# Patient Record
Sex: Male | Born: 1990 | Hispanic: Yes | Marital: Married | Smoking: Current some day smoker
Health system: Southern US, Community
[De-identification: ages and names within clinical notes are randomized; demographics above are authoritative.]

## PROBLEM LIST (undated history)

## (undated) DIAGNOSIS — W3400XA Accidental discharge from unspecified firearms or gun, initial encounter: Secondary | ICD-10-CM

## (undated) HISTORY — DX: Accidental discharge from unspecified firearms or gun, initial encounter: W34.00XA

---

## 2017-02-16 ENCOUNTER — Observation Stay (HOSPITAL_COMMUNITY)
Admission: EM | Admit: 2017-02-16 | Discharge: 2017-02-17 | Disposition: A | Discharge status: Home / Self Care | Payer: No Typology Code available for payment source | Attending: General Surgery | Admitting: General Surgery

## 2017-02-16 ENCOUNTER — Emergency Department (HOSPITAL_COMMUNITY): Payer: No Typology Code available for payment source

## 2017-02-16 ENCOUNTER — Encounter (HOSPITAL_COMMUNITY): Payer: Self-pay

## 2017-02-16 DIAGNOSIS — S3210XB Unspecified fracture of sacrum, initial encounter for open fracture: Secondary | ICD-10-CM | POA: Insufficient documentation

## 2017-02-16 DIAGNOSIS — S129XXA Fracture of neck, unspecified, initial encounter: Secondary | ICD-10-CM

## 2017-02-16 DIAGNOSIS — S32302A Unspecified fracture of left ilium, initial encounter for closed fracture: Secondary | ICD-10-CM

## 2017-02-16 DIAGNOSIS — F1490 Cocaine use, unspecified, uncomplicated: Secondary | ICD-10-CM | POA: Diagnosis not present

## 2017-02-16 DIAGNOSIS — Z23 Encounter for immunization: Secondary | ICD-10-CM | POA: Diagnosis not present

## 2017-02-16 DIAGNOSIS — W3400XA Accidental discharge from unspecified firearms or gun, initial encounter: Secondary | ICD-10-CM

## 2017-02-16 DIAGNOSIS — R402412 Glasgow coma scale score 13-15, at arrival to emergency department: Secondary | ICD-10-CM | POA: Diagnosis not present

## 2017-02-16 DIAGNOSIS — S12500B Unspecified displaced fracture of sixth cervical vertebra, initial encounter for open fracture: Principal | ICD-10-CM | POA: Insufficient documentation

## 2017-02-16 DIAGNOSIS — F17210 Nicotine dependence, cigarettes, uncomplicated: Secondary | ICD-10-CM | POA: Insufficient documentation

## 2017-02-16 DIAGNOSIS — S32302B Unspecified fracture of left ilium, initial encounter for open fracture: Secondary | ICD-10-CM | POA: Diagnosis not present

## 2017-02-16 DIAGNOSIS — S12100A Unspecified displaced fracture of second cervical vertebra, initial encounter for closed fracture: Secondary | ICD-10-CM | POA: Diagnosis present

## 2017-02-16 HISTORY — DX: Accidental discharge from unspecified firearms or gun, initial encounter: W34.00XA

## 2017-02-16 LAB — I-STAT CHEM 8, ED
BUN: 15 mg/dL (ref 6–20)
CALCIUM ION: 1.15 mmol/L (ref 1.15–1.40)
CREATININE: 0.8 mg/dL (ref 0.61–1.24)
Chloride: 107 mmol/L (ref 101–111)
GLUCOSE: 110 mg/dL — AB (ref 65–99)
HCT: 40 % (ref 39.0–52.0)
HEMOGLOBIN: 13.6 g/dL (ref 13.0–17.0)
Potassium: 3.6 mmol/L (ref 3.5–5.1)
Sodium: 142 mmol/L (ref 135–145)
TCO2: 25 mmol/L (ref 22–32)

## 2017-02-16 LAB — PREPARE FRESH FROZEN PLASMA
UNIT DIVISION: 0
Unit division: 0

## 2017-02-16 LAB — COMPREHENSIVE METABOLIC PANEL
ALT: 29 U/L (ref 17–63)
ANION GAP: 8 (ref 5–15)
AST: 33 U/L (ref 15–41)
Albumin: 4.1 g/dL (ref 3.5–5.0)
Alkaline Phosphatase: 77 U/L (ref 38–126)
BUN: 14 mg/dL (ref 6–20)
CHLORIDE: 106 mmol/L (ref 101–111)
CO2: 25 mmol/L (ref 22–32)
CREATININE: 0.83 mg/dL (ref 0.61–1.24)
Calcium: 9 mg/dL (ref 8.9–10.3)
Glucose, Bld: 109 mg/dL — ABNORMAL HIGH (ref 65–99)
POTASSIUM: 3.6 mmol/L (ref 3.5–5.1)
Sodium: 139 mmol/L (ref 135–145)
Total Bilirubin: 1.3 mg/dL — ABNORMAL HIGH (ref 0.3–1.2)
Total Protein: 7.1 g/dL (ref 6.5–8.1)

## 2017-02-16 LAB — CDS SEROLOGY

## 2017-02-16 LAB — BPAM RBC
BLOOD PRODUCT EXPIRATION DATE: 201810282359
Blood Product Expiration Date: 201810222359
ISSUE DATE / TIME: 201810080715
ISSUE DATE / TIME: 201810080715
Unit Type and Rh: 9500
Unit Type and Rh: 9500

## 2017-02-16 LAB — TYPE AND SCREEN
ABO/RH(D): O POS
Antibody Screen: NEGATIVE
UNIT DIVISION: 0
Unit division: 0

## 2017-02-16 LAB — PROTIME-INR
INR: 1
PROTHROMBIN TIME: 13.1 s (ref 11.4–15.2)

## 2017-02-16 LAB — ABO/RH: ABO/RH(D): O POS

## 2017-02-16 LAB — CBC
HEMATOCRIT: 41.6 % (ref 39.0–52.0)
Hemoglobin: 14.1 g/dL (ref 13.0–17.0)
MCH: 29.7 pg (ref 26.0–34.0)
MCHC: 33.9 g/dL (ref 30.0–36.0)
MCV: 87.6 fL (ref 78.0–100.0)
PLATELETS: 206 10*3/uL (ref 150–400)
RBC: 4.75 MIL/uL (ref 4.22–5.81)
RDW: 12.4 % (ref 11.5–15.5)
WBC: 6.7 10*3/uL (ref 4.0–10.5)

## 2017-02-16 LAB — BPAM FFP
BLOOD PRODUCT EXPIRATION DATE: 201810122359
Blood Product Expiration Date: 201810122359
ISSUE DATE / TIME: 201810080715
ISSUE DATE / TIME: 201810080715
UNIT TYPE AND RH: 6200
Unit Type and Rh: 6200

## 2017-02-16 LAB — ETHANOL: Alcohol, Ethyl (B): <10 mg/dL (ref <10)

## 2017-02-16 LAB — I-STAT CG4 LACTIC ACID, ED: Lactic Acid, Venous: 1.31 mmol/L (ref 0.5–1.9)

## 2017-02-16 MED ORDER — PANTOPRAZOLE SODIUM 40 MG IV SOLR: 40.0000 mg | Freq: Every day | INTRAVENOUS | Status: DC

## 2017-02-16 MED ORDER — KCL IN DEXTROSE-NACL 20-5-0.45 MEQ/L-%-% IV SOLN
INTRAVENOUS | Status: DC
  Administered 2017-02-16 – 2017-02-17 (×2): via INTRAVENOUS
  Filled 2017-02-16 (×4): qty 1000

## 2017-02-16 MED ORDER — IOPAMIDOL (ISOVUE-300) INJECTION 61%
INTRAVENOUS | Status: AC
  Filled 2017-02-16: qty 100

## 2017-02-16 MED ORDER — IOPAMIDOL (ISOVUE-300) INJECTION 61%
100.0000 mL | Freq: Once | INTRAVENOUS | Status: AC | PRN
  Administered 2017-02-16: 100 mL via INTRAVENOUS

## 2017-02-16 MED ORDER — DOCUSATE SODIUM 100 MG PO CAPS: 100.0000 mg | ORAL_CAPSULE | Freq: Two times a day (BID) | ORAL | Status: DC | PRN

## 2017-02-16 MED ORDER — TETANUS-DIPHTH-ACELL PERTUSSIS 5-2.5-18.5 LF-MCG/0.5 IM SUSP
0.5000 mL | Freq: Once | INTRAMUSCULAR | Status: AC
  Administered 2017-02-16: 0.5 mL via INTRAMUSCULAR
  Filled 2017-02-16: qty 0.5

## 2017-02-16 MED ORDER — HYDROMORPHONE HCL 1 MG/ML IJ SOLN
0.5000 mg | INTRAMUSCULAR | Status: DC | PRN
  Administered 2017-02-16: 1 mg via INTRAVENOUS
  Filled 2017-02-16: qty 1

## 2017-02-16 MED ORDER — ONDANSETRON HCL 4 MG/2ML IJ SOLN: 4.0000 mg | Freq: Four times a day (QID) | INTRAMUSCULAR | Status: DC | PRN

## 2017-02-16 MED ORDER — PANTOPRAZOLE SODIUM 40 MG PO TBEC
40.0000 mg | DELAYED_RELEASE_TABLET | Freq: Every day | ORAL | Status: DC
  Administered 2017-02-16 – 2017-02-17 (×2): 40 mg via ORAL
  Filled 2017-02-16 (×2): qty 1

## 2017-02-16 MED ORDER — OXYCODONE HCL 5 MG PO TABS
5.0000 mg | ORAL_TABLET | ORAL | Status: DC | PRN
  Administered 2017-02-16 – 2017-02-17 (×3): 5 mg via ORAL
  Filled 2017-02-16 (×3): qty 1

## 2017-02-16 MED ORDER — ONDANSETRON 4 MG PO TBDP: 4.0000 mg | ORAL_TABLET | Freq: Four times a day (QID) | ORAL | Status: DC | PRN

## 2017-02-16 MED ORDER — FENTANYL CITRATE (PF) 100 MCG/2ML IJ SOLN
50.0000 ug | Freq: Once | INTRAMUSCULAR | Status: AC
  Administered 2017-02-16: 50 ug via INTRAVENOUS
  Filled 2017-02-16: qty 2
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
## 2017-02-16 NOTE — Progress Notes (Signed)
   02/16/17 0900  Clinical Encounter Type  Visited With Patient  Visit Type Initial  Referral From Chaplain  Consult/Referral To Chaplain  Spiritual Encounters  Spiritual Needs Prayer   Chaplain handed off from overnight Sunday.  Followed up and visited with the patient.  We had prayer together.  Patient being moved to another space.  Was trying to figure out about calling his girlfriend but patient did not have his phone.  Will pass on the [employer] covering that floor. Chaplain Agustin Cree
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
## 2017-02-16 NOTE — ED Notes (Signed)
Pt taken to CT with this RN.
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
## 2017-02-16 NOTE — ED Notes (Signed)
CSI at the bedside 
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
## 2017-02-16 NOTE — ED Notes (Signed)
Attempted Report X 1 

## 2017-02-17 ENCOUNTER — Observation Stay (HOSPITAL_COMMUNITY): Payer: No Typology Code available for payment source

## 2017-02-17 ENCOUNTER — Encounter (HOSPITAL_COMMUNITY): Payer: Self-pay | Admitting: *Deleted

## 2017-02-17 LAB — HIV ANTIBODY (ROUTINE TESTING W REFLEX): HIV Screen 4th Generation wRfx: NONREACTIVE

## 2017-02-17 LAB — CBC
HEMATOCRIT: 38.4 % — AB (ref 39.0–52.0)
HEMOGLOBIN: 13.3 g/dL (ref 13.0–17.0)
MCH: 30.4 pg (ref 26.0–34.0)
MCHC: 34.6 g/dL (ref 30.0–36.0)
MCV: 87.9 fL (ref 78.0–100.0)
Platelets: 215 10*3/uL (ref 150–400)
RBC: 4.37 MIL/uL (ref 4.22–5.81)
RDW: 12.3 % (ref 11.5–15.5)
WBC: 8.2 10*3/uL (ref 4.0–10.5)

## 2017-02-17 LAB — BASIC METABOLIC PANEL
ANION GAP: 8 (ref 5–15)
BUN: 9 mg/dL (ref 6–20)
CHLORIDE: 101 mmol/L (ref 101–111)
CO2: 27 mmol/L (ref 22–32)
Calcium: 8.8 mg/dL — ABNORMAL LOW (ref 8.9–10.3)
Creatinine, Ser: 0.85 mg/dL (ref 0.61–1.24)
GFR calc Af Amer: >60 mL/min (ref >60)
GFR calc non Af Amer: >60 mL/min (ref >60)
GLUCOSE: 117 mg/dL — AB (ref 65–99)
POTASSIUM: 3.5 mmol/L (ref 3.5–5.1)
Sodium: 136 mmol/L (ref 135–145)

## 2017-02-17 LAB — BLOOD PRODUCT ORDER (VERBAL) VERIFICATION

## 2017-02-17 MED ORDER — BACITRACIN ZINC 500 UNIT/GM EX OINT
TOPICAL_OINTMENT | Freq: Two times a day (BID) | CUTANEOUS | Status: DC
  Administered 2017-02-17: 10:00:00 via TOPICAL
  Filled 2017-02-17: qty 28.35

## 2017-02-17 MED ORDER — OXYCODONE HCL 5 MG PO TABS: 5.0000 mg | ORAL_TABLET | ORAL | 0 refills | Status: DC | PRN

## 2017-02-17 MED ORDER — ACETAMINOPHEN 500 MG PO TABS: 1000.0000 mg | ORAL_TABLET | Freq: Four times a day (QID) | ORAL | 0 refills | Status: AC | PRN

## 2017-02-17 MED ORDER — BACITRACIN ZINC 500 UNIT/GM EX OINT: TOPICAL_OINTMENT | Freq: Two times a day (BID) | CUTANEOUS | 0 refills | Status: DC

## 2017-02-17 MED ORDER — ACETAMINOPHEN 500 MG PO TABS
1000.0000 mg | ORAL_TABLET | Freq: Four times a day (QID) | ORAL | Status: DC
  Administered 2017-02-17 (×2): 1000 mg via ORAL
  Filled 2017-02-17 (×2): qty 2

## 2017-02-17 NOTE — Progress Notes (Signed)
Orthopedic Tech Progress Note Patient Details:  Christopher Huerta [birth year] [id number]  Ortho Devices Type of Ortho Device: Crutches Ortho Device/Splint Interventions: Application   [name] 02/17/2017, 11:29 AM Viewed order from doctor's order list

## 2017-02-17 NOTE — Progress Notes (Signed)
Discharge instructions reviewed with pt, pt's girlfriend and pt's parents using girlfriend as interpreter and prescriptions given.  All verbalized understanding and questions answered.  Pt discharged in stable condition via wheelchair with girlfriend and parents.  Hector Shade Zayante
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
## 2017-02-25 ENCOUNTER — Encounter: Payer: Self-pay | Admitting: Physical Therapy

## 2017-02-25 ENCOUNTER — Ambulatory Visit: Payer: Self-pay | Attending: General Surgery | Admitting: Physical Therapy

## 2017-02-25 DIAGNOSIS — M542 Cervicalgia: Secondary | ICD-10-CM | POA: Insufficient documentation

## 2017-02-25 DIAGNOSIS — M25652 Stiffness of left hip, not elsewhere classified: Secondary | ICD-10-CM | POA: Insufficient documentation
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
## 2017-03-04 ENCOUNTER — Encounter: Payer: Self-pay | Admitting: Internal Medicine

## 2017-03-04 ENCOUNTER — Ambulatory Visit: Payer: Self-pay | Attending: Internal Medicine | Admitting: Internal Medicine

## 2017-03-04 DIAGNOSIS — W3400XA Accidental discharge from unspecified firearms or gun, initial encounter: Secondary | ICD-10-CM

## 2017-03-04 DIAGNOSIS — M542 Cervicalgia: Secondary | ICD-10-CM

## 2017-03-04 DIAGNOSIS — W3400XD Accidental discharge from unspecified firearms or gun, subsequent encounter: Secondary | ICD-10-CM | POA: Insufficient documentation

## 2017-03-04 DIAGNOSIS — M4050 Lordosis, unspecified, site unspecified: Secondary | ICD-10-CM | POA: Insufficient documentation

## 2017-03-04 DIAGNOSIS — S1193XD Puncture wound without foreign body of unspecified part of neck, subsequent encounter: Secondary | ICD-10-CM | POA: Insufficient documentation

## 2017-03-04 DIAGNOSIS — S71032D Puncture wound without foreign body, left hip, subsequent encounter: Secondary | ICD-10-CM | POA: Insufficient documentation

## 2017-03-04 DIAGNOSIS — S12500D Unspecified displaced fracture of sixth cervical vertebra, subsequent encounter for fracture with routine healing: Secondary | ICD-10-CM | POA: Insufficient documentation

## 2017-03-04 NOTE — Progress Notes (Signed)
appt for follow up. Pt c/o pain BLE.
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
## 2017-03-04 NOTE — Patient Instructions (Signed)
It was good to see you today.  We have reviewed your prior records including labs and tests today  Medications reviewed and updated, no changes recommended at this time.  Okay to return to work Monday, October 29 as discussed.  Please call if worsening pain or other problems between now and that time.  Please schedule followup as needed
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
## 2017-03-31 DIAGNOSIS — M542 Cervicalgia: Secondary | ICD-10-CM | POA: Insufficient documentation
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
## 2018-09-09 ENCOUNTER — Ambulatory Visit: Payer: Self-pay | Admitting: Family Medicine

## 2018-10-18 ENCOUNTER — Other Ambulatory Visit: Payer: Self-pay

## 2018-10-18 ENCOUNTER — Ambulatory Visit: Payer: Self-pay | Admitting: Family Medicine

## 2018-10-29 ENCOUNTER — Ambulatory Visit: Payer: Self-pay | Admitting: Family Medicine
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
## 2019-03-20 ENCOUNTER — Encounter (HOSPITAL_COMMUNITY): Payer: Self-pay | Admitting: *Deleted

## 2019-03-20 ENCOUNTER — Emergency Department (HOSPITAL_COMMUNITY): Payer: Self-pay

## 2019-03-20 ENCOUNTER — Emergency Department (HOSPITAL_COMMUNITY)
Admission: EM | Admit: 2019-03-20 | Discharge: 2019-03-20 | Disposition: A | Discharge status: Home / Self Care | Payer: Self-pay | Attending: Emergency Medicine | Admitting: Emergency Medicine

## 2019-03-20 ENCOUNTER — Other Ambulatory Visit: Payer: Self-pay

## 2019-03-20 DIAGNOSIS — F17210 Nicotine dependence, cigarettes, uncomplicated: Secondary | ICD-10-CM | POA: Insufficient documentation

## 2019-03-20 DIAGNOSIS — L03221 Cellulitis of neck: Secondary | ICD-10-CM | POA: Insufficient documentation

## 2019-03-20 LAB — CBC WITH DIFFERENTIAL/PLATELET
Abs Immature Granulocytes: 0.01 10*3/uL (ref 0.00–0.07)
Basophils Absolute: 0 10*3/uL (ref 0.0–0.1)
Basophils Relative: 0 %
Eosinophils Absolute: 0 10*3/uL (ref 0.0–0.5)
Eosinophils Relative: 1 %
HCT: 42.5 % (ref 39.0–52.0)
Hemoglobin: 14.6 g/dL (ref 13.0–17.0)
Immature Granulocytes: 0 %
Lymphocytes Relative: 31 %
Lymphs Abs: 1.7 10*3/uL (ref 0.7–4.0)
MCH: 30.3 pg (ref 26.0–34.0)
MCHC: 34.4 g/dL (ref 30.0–36.0)
MCV: 88.2 fL (ref 80.0–100.0)
Monocytes Absolute: 0.3 10*3/uL (ref 0.1–1.0)
Monocytes Relative: 6 %
Neutro Abs: 3.6 10*3/uL (ref 1.7–7.7)
Neutrophils Relative %: 62 %
Platelets: 274 10*3/uL (ref 150–400)
RBC: 4.82 MIL/uL (ref 4.22–5.81)
RDW: 11.8 % (ref 11.5–15.5)
WBC: 5.7 10*3/uL (ref 4.0–10.5)
nRBC: 0 % (ref 0.0–0.2)

## 2019-03-20 LAB — BASIC METABOLIC PANEL
Anion gap: 11 (ref 5–15)
BUN: 10 mg/dL (ref 6–20)
CO2: 24 mmol/L (ref 22–32)
Calcium: 9.5 mg/dL (ref 8.9–10.3)
Chloride: 104 mmol/L (ref 98–111)
Creatinine, Ser: 0.87 mg/dL (ref 0.61–1.24)
GFR calc Af Amer: >60 mL/min (ref >60)
GFR calc non Af Amer: >60 mL/min (ref >60)
Glucose, Bld: 99 mg/dL (ref 70–99)
Potassium: 4 mmol/L (ref 3.5–5.1)
Sodium: 139 mmol/L (ref 135–145)

## 2019-03-20 MED ORDER — MORPHINE SULFATE (PF) 4 MG/ML IV SOLN
4.0000 mg | Freq: Once | INTRAVENOUS | Status: AC
  Administered 2019-03-20: 13:00:00 4 mg via INTRAVENOUS
  Filled 2019-03-20: qty 1

## 2019-03-20 MED ORDER — DOXYCYCLINE HYCLATE 100 MG PO CAPS: 100.0000 mg | ORAL_CAPSULE | Freq: Two times a day (BID) | ORAL | 0 refills | Status: AC

## 2019-03-20 MED ORDER — DOXYCYCLINE HYCLATE 100 MG PO TABS
100.0000 mg | ORAL_TABLET | Freq: Once | ORAL | Status: AC
  Administered 2019-03-20: 15:00:00 100 mg via ORAL
  Filled 2019-03-20: qty 1

## 2019-03-20 MED ORDER — IOHEXOL 300 MG/ML  SOLN
75.0000 mL | Freq: Once | INTRAMUSCULAR | Status: AC | PRN
  Administered 2019-03-20: 13:00:00 75 mL via INTRAVENOUS

## 2019-03-20 MED ORDER — ONDANSETRON HCL 4 MG/2ML IJ SOLN
4.0000 mg | Freq: Once | INTRAMUSCULAR | Status: AC
  Administered 2019-03-20: 13:00:00 4 mg via INTRAVENOUS
  Filled 2019-03-20: qty 2

## 2019-03-20 MED ORDER — CEPHALEXIN 250 MG PO CAPS: 500.0000 mg | ORAL_CAPSULE | Freq: Once | ORAL | Status: DC

## 2019-03-20 MED ORDER — OXYCODONE-ACETAMINOPHEN 5-325 MG PO TABS: 1.0000 | ORAL_TABLET | Freq: Four times a day (QID) | ORAL | 0 refills | Status: AC | PRN

## 2019-03-20 MED ORDER — METFORMIN HCL 500 MG PO TABS: 500.0000 mg | ORAL_TABLET | Freq: Once | ORAL | Status: DC
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
## 2019-03-20 NOTE — ED Notes (Signed)
Patient transported to CT 

## 2019-03-20 NOTE — ED Notes (Signed)
Patient verbalizes understanding of discharge instructions. Opportunity for questioning and answers were provided. Armband removed by staff, pt discharged from ED.  

## 2019-03-20 NOTE — Discharge Instructions (Signed)
You have been seen today for neck pain. Please read and follow all provided instructions. Return to the emergency room for worsening condition or new concerning symptoms.    CT scan today shows you have an infection in your neck.  1. Medications:  Prescription sent to your pharmacy for doxycycline.  This is an antibiotic used to treat infection.  Please take as prescribed.  You are given your first dose today and you can start taking it tomorrow morning. -Prescription also sent for Percocet.  This is a narcotic pain medicine.  You can take this for severe pain only.  You cannot drive or work while taking this medication as it can make you drowsy.  -For mild to moderate pain you can take ibuprofen.  Continue usual home medications Take medications as prescribed. Please review all of the medicines and only take them if you do not have an allergy to them.   2. Treatment: rest, drink plenty of fluids  3. Follow Up: Please follow up with surgeon Dr. Lavone Neri.  Please call his office Monday morning to schedule a follow-up appointment to be seen within 1 week.  It is also a possibility that you have an allergic reaction to any of the medicines that you have been prescribed - Everybody reacts differently to medications and while MOST people have no trouble with most medicines, you may have a reaction such as nausea, vomiting, rash, swelling, shortness of breath. If this is the case, please stop taking the medicine immediately and contact your physician.  ?

## 2019-03-23 ENCOUNTER — Other Ambulatory Visit: Payer: Self-pay

## 2019-03-23 ENCOUNTER — Ambulatory Visit (INDEPENDENT_AMBULATORY_CARE_PROVIDER_SITE_OTHER): Payer: Self-pay | Admitting: Family Medicine

## 2019-03-23 ENCOUNTER — Encounter: Payer: Self-pay | Admitting: Family Medicine

## 2019-03-23 VITALS — BP 110/56 | HR 77 | Ht 69.0 in | Wt 230.4 lb

## 2019-03-23 DIAGNOSIS — W3400XA Accidental discharge from unspecified firearms or gun, initial encounter: Secondary | ICD-10-CM

## 2019-03-23 DIAGNOSIS — M7918 Myalgia, other site: Secondary | ICD-10-CM
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
## 2019-03-24 DIAGNOSIS — W3400XA Accidental discharge from unspecified firearms or gun, initial encounter: Secondary | ICD-10-CM | POA: Insufficient documentation
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
